# Patient Record
Sex: Male | Born: 1970 | Race: White | Hispanic: No | Marital: Married | Smoking: Never smoker
Health system: Southern US, Community
[De-identification: ages and names within clinical notes are randomized; demographics above are authoritative.]

## PROBLEM LIST (undated history)

## (undated) DIAGNOSIS — I10 Essential (primary) hypertension: Secondary | ICD-10-CM

## (undated) DIAGNOSIS — F419 Anxiety disorder, unspecified: Secondary | ICD-10-CM

## (undated) DIAGNOSIS — F32A Depression, unspecified: Secondary | ICD-10-CM

## (undated) DIAGNOSIS — F329 Major depressive disorder, single episode, unspecified: Secondary | ICD-10-CM

## (undated) HISTORY — PX: TONSILLECTOMY: SUR1361

## (undated) HISTORY — PX: FOOT SURGERY: SHX648

---

## 2012-09-25 ENCOUNTER — Inpatient Hospital Stay: Payer: Self-pay | Admitting: Internal Medicine

## 2012-09-25 LAB — COMPREHENSIVE METABOLIC PANEL
Alkaline Phosphatase: 92 U/L (ref 50–136)
BUN: 13 mg/dL (ref 7–18)
Bilirubin,Total: 0.7 mg/dL (ref 0.2–1.0)
Calcium, Total: 8.7 mg/dL (ref 8.5–10.1)
EGFR (African American): >60
Glucose: 126 mg/dL — ABNORMAL HIGH (ref 65–99)
Osmolality: 277 (ref 275–301)
SGOT(AST): 38 U/L — ABNORMAL HIGH (ref 15–37)
SGPT (ALT): 37 U/L (ref 12–78)
Sodium: 138 mmol/L (ref 136–145)
Total Protein: 7.4 g/dL (ref 6.4–8.2)

## 2012-09-25 LAB — URINALYSIS, COMPLETE
Blood: NEGATIVE
Glucose,UR: NEGATIVE mg/dL (ref 0–75)
Ketone: NEGATIVE
Leukocyte Esterase: NEGATIVE
RBC,UR: 2 /HPF (ref 0–5)
Specific Gravity: 1.03 (ref 1.003–1.030)
Squamous Epithelial: 4

## 2012-09-25 LAB — CBC
MCHC: 34.2 g/dL (ref 32.0–36.0)
MCV: 87 fL (ref 80–100)
Platelet: 239 10*3/uL (ref 150–440)
RBC: 4.46 10*6/uL (ref 4.40–5.90)
WBC: 21.7 10*3/uL — ABNORMAL HIGH (ref 3.8–10.6)

## 2012-09-26 LAB — CBC WITH DIFFERENTIAL/PLATELET
Basophil #: 0.1 10*3/uL (ref 0.0–0.1)
Basophil %: 0.3 %
Eosinophil %: 0.9 %
HCT: 36 % — ABNORMAL LOW (ref 40.0–52.0)
HGB: 12.2 g/dL — ABNORMAL LOW (ref 13.0–18.0)
Lymphocyte #: 2.1 10*3/uL (ref 1.0–3.6)
Lymphocyte %: 10.3 %
MCHC: 34 g/dL (ref 32.0–36.0)
MCV: 87 fL (ref 80–100)
Monocyte #: 1.7 x10 3/mm — ABNORMAL HIGH (ref 0.2–1.0)
Neutrophil #: 16.1 10*3/uL — ABNORMAL HIGH (ref 1.4–6.5)
Neutrophil %: 80.3 %
Platelet: 241 10*3/uL (ref 150–440)
WBC: 20.1 10*3/uL — ABNORMAL HIGH (ref 3.8–10.6)

## 2012-09-26 LAB — BASIC METABOLIC PANEL
Anion Gap: 7 (ref 7–16)
BUN: 9 mg/dL (ref 7–18)
Chloride: 103 mmol/L (ref 98–107)
Co2: 25 mmol/L (ref 21–32)
Creatinine: 0.94 mg/dL (ref 0.60–1.30)
EGFR (Non-African Amer.): >60
Osmolality: 269 (ref 275–301)
Potassium: 3.2 mmol/L — ABNORMAL LOW (ref 3.5–5.1)

## 2012-09-26 LAB — TSH: Thyroid Stimulating Horm: 0.841 u[IU]/mL

## 2012-09-26 LAB — MAGNESIUM: Magnesium: 1.7 mg/dL — ABNORMAL LOW

## 2012-09-27 LAB — CBC WITH DIFFERENTIAL/PLATELET
Basophil %: 0.2 %
Eosinophil %: 1.7 %
HCT: 34.4 % — ABNORMAL LOW (ref 40.0–52.0)
Lymphocyte %: 16.4 %
MCH: 29.6 pg (ref 26.0–34.0)
MCHC: 34 g/dL (ref 32.0–36.0)
Neutrophil #: 10.9 10*3/uL — ABNORMAL HIGH (ref 1.4–6.5)
Neutrophil %: 73.3 %
Platelet: 277 10*3/uL (ref 150–440)
RBC: 3.96 10*6/uL — ABNORMAL LOW (ref 4.40–5.90)
RDW: 13.9 % (ref 11.5–14.5)

## 2012-09-27 LAB — BASIC METABOLIC PANEL
Anion Gap: 6 — ABNORMAL LOW (ref 7–16)
BUN: 9 mg/dL (ref 7–18)
Calcium, Total: 8.6 mg/dL (ref 8.5–10.1)
Creatinine: 0.99 mg/dL (ref 0.60–1.30)
EGFR (African American): >60
Osmolality: 272 (ref 275–301)
Potassium: 3.2 mmol/L — ABNORMAL LOW (ref 3.5–5.1)
Sodium: 137 mmol/L (ref 136–145)

## 2012-09-28 LAB — WBC: WBC: 9.3 10*3/uL (ref 3.8–10.6)

## 2012-09-29 LAB — MAGNESIUM: Magnesium: 2.4 mg/dL

## 2012-09-30 LAB — URINALYSIS, COMPLETE
Bacteria: NONE SEEN
Bilirubin,UR: NEGATIVE
Glucose,UR: NEGATIVE mg/dL (ref 0–75)
Ketone: NEGATIVE
Leukocyte Esterase: NEGATIVE
Protein: NEGATIVE
RBC,UR: 2 /HPF (ref 0–5)
Specific Gravity: 1.01 (ref 1.003–1.030)
Squamous Epithelial: <1
WBC UR: 2 /HPF (ref 0–5)

## 2012-09-30 LAB — CULTURE, BLOOD (SINGLE)

## 2012-09-30 LAB — CREATININE, SERUM
Creatinine: 1.97 mg/dL — ABNORMAL HIGH (ref 0.60–1.30)
EGFR (African American): 48 — ABNORMAL LOW
EGFR (Non-African Amer.): 41 — ABNORMAL LOW

## 2012-09-30 LAB — BASIC METABOLIC PANEL
Chloride: 106 mmol/L (ref 98–107)
Co2: 29 mmol/L (ref 21–32)
Creatinine: 2.06 mg/dL — ABNORMAL HIGH (ref 0.60–1.30)
EGFR (Non-African Amer.): 39 — ABNORMAL LOW
Osmolality: 282 (ref 275–301)
Potassium: 4.4 mmol/L (ref 3.5–5.1)

## 2012-10-01 LAB — CBC WITH DIFFERENTIAL/PLATELET
Basophil #: 0.1 10*3/uL (ref 0.0–0.1)
Eosinophil #: 0.3 10*3/uL (ref 0.0–0.7)
HCT: 34.7 % — ABNORMAL LOW (ref 40.0–52.0)
HGB: 11.9 g/dL — ABNORMAL LOW (ref 13.0–18.0)
Lymphocyte %: 21.6 %
MCH: 29.6 pg (ref 26.0–34.0)
MCV: 86 fL (ref 80–100)
Monocyte #: 1.2 x10 3/mm — ABNORMAL HIGH (ref 0.2–1.0)
Monocyte %: 11.2 %
Neutrophil %: 63.3 %
Platelet: 337 10*3/uL (ref 150–440)
WBC: 10.4 10*3/uL (ref 3.8–10.6)

## 2012-10-01 LAB — BASIC METABOLIC PANEL
BUN: 15 mg/dL (ref 7–18)
Calcium, Total: 8.8 mg/dL (ref 8.5–10.1)
Chloride: 106 mmol/L (ref 98–107)
Co2: 28 mmol/L (ref 21–32)
Glucose: 117 mg/dL — ABNORMAL HIGH (ref 65–99)
Osmolality: 279 (ref 275–301)
Potassium: 3.6 mmol/L (ref 3.5–5.1)
Sodium: 139 mmol/L (ref 136–145)

## 2012-10-01 LAB — PROTEIN / CREATININE RATIO, URINE
Creatinine, Urine: 78 mg/dL (ref 30.0–125.0)
Protein/Creat. Ratio: 141 mg/gCREAT (ref 0–200)

## 2012-10-02 LAB — BASIC METABOLIC PANEL
Anion Gap: 4 — ABNORMAL LOW (ref 7–16)
BUN: 16 mg/dL (ref 7–18)
Calcium, Total: 8.7 mg/dL (ref 8.5–10.1)
Creatinine: 2.25 mg/dL — ABNORMAL HIGH (ref 0.60–1.30)
EGFR (African American): 41 — ABNORMAL LOW
Glucose: 82 mg/dL (ref 65–99)
Potassium: 4 mmol/L (ref 3.5–5.1)
Sodium: 139 mmol/L (ref 136–145)

## 2012-10-02 LAB — PROTEIN ELECTROPHORESIS(ARMC)

## 2012-10-02 LAB — WOUND CULTURE

## 2012-10-03 LAB — UR PROT ELECTROPHORESIS, URINE RANDOM

## 2014-05-10 NOTE — Consult Note (Signed)
Final Culture consistant with MRSA.is somewhat of a different presentation for MRSA as usually more skin is involved.to improvecare going wellimproving.improving.for discharge on Oral Abx in near future.tid wound care.7-10 days.  Electronic Signatures: Smith Robertope, Valita Righter S (MD)  (Signed on 12-Sep-14 13:51)  Authored  Last Updated: 12-Sep-14 13:51 by Smith Robertope, Paxton Kanaan S (MD)

## 2014-05-10 NOTE — Consult Note (Signed)
Patient seen, chart reviewed, films reviewed, note dictated. Scrotal cellulitis, urinary retention. The findings are more consistent with cellulitis at this time.  There has not been a declared abscess at present.  The initial skin lesion in the LEFT groin crease demonstrates no areas of fluctuance.  No other areas of fluctuance are appreciated throughout the scrotum.  This is mainly erythema and edema.  This will need to be monitored over the next 24-48 hours.  If there is evidence of a developing abscess, surgical drainage will be indicated.  If no abscess declares, antibiotic therapy is otherwise all that is indicated.  The bladder volume was overall minimal at the time of catheter placement.  This is not entirely consistent with urinary retention.  It is unlikely at his age.  Pain and other factors were likely more at play.  Would recommend starting Flomax 0.4 mg.  We will also recommend removing the Foley catheter in the morning with voiding trial.  We will continue to monitor on a daily basis as for any changes.  If there is any evidence of abscess, we will proceed as indicated.  If there are any further questions, please feel free to contact us.  Electronic Signatures: Smith Robertope, Denney Shein S (MD)  (Signed on 09-Sep-14 17:50)  Authored  Last Updated: 09-Sep-14 17:50 by Smith Robertope, Latonia Conrow S (MD)

## 2014-05-10 NOTE — Discharge Summary (Signed)
PATIENT NAME:  Alex Calderon, Nour MR#:  [id number] DATE OF BIRTH:  [birth year]  DATE OF ADMISSION:  09/25/2012 DATE OF DISCHARGE:  10/02/2012  ADMITTING DIAGNOSIS: Scrotal swelling, difficulty with urination.   DISCHARGE DIAGNOSES:  1. Scrotal swelling due to a scrotal abscess, status post incision and drainage of the scrotal abscess.  2. Acute renal failure, possibly due to interstitial nephritis, possibly related to vancomycin. Outpatient followup with nephrology.  3. Anxiety and depression.  4. History of plantar fasciitis release in both lower extremities.   CONSULTANTS:  1. Stann Mainlandavid P. Sampson GoonFitzgerald, MD 2. Madolyn FriezeBrian S. Achilles Dunkope, MD 3. Munsoor Lizabeth LeydenN. Lateef, MD  PERTINENT LABORATORIES AND EVALUATIONS: Admitting glucose 94, BUN 9, creatinine 0.99, sodium 137, potassium 3.2, chloride 105, CO2 is 26, calcium 8.6. WBC count 14.8, hemoglobin 11.7, platelet count 277. One culture showed MRSA, oxacillin resistant, Cipro  resistant, erythromycin resistant. Urine eosinophils negative. HIV was negative. Ultrasound of the kidneys showed no evidence of hydronephrosis. Kidneys appear normal size. Ultrasound of the testes shows increased scrotal wall thickening, may reflect sequela of cellulitis.   HOSPITAL COURSE: Please refer to H and P done by the admitting physician. The patient is a 44 year old male with history of chronic depression and anxiety, who presented with complaint of 2-day history of scrotal swelling. ER ultrasound was done and showed severe cellulitis. The patient was admitted for further evaluation and treatment. He was seen by ID and Dr. Achilles Dunkope of urology. The patient underwent an incision and drainage of the scrotal abscess done by urology, without any complications. The patient was continued with local wound care. He was continued on broad-spectrum IV antibiotics per ID discretion. The patient's renal function did worsen, and his creatinine went as high as 2.25. The patient was seen by nephrology, and they  recommended IV fluids. The patient was very anxious to go. Therefore, he is being discharged with outpatient nephrology followup.   DISCHARGE MEDICATIONS:  1. Adderall 10 one tab p.o. b.i.d. 2. Provigil 100 one tab p.o. daily. 3. Cymbalta 60 daily. 4. Klonopin 0.5 at bedtime. 5. Nucynta 50 mg 1 tab p.o. t.i.d. 6. Lyrica 50 one tab p.o. b.i.d. 7. Tylenol 650 q.4 p.r.n. 8. Norco 325/10 one tab p.o. t.i.d.  9. Flomax 0.4 daily.  10. Mag oxide 400 one tab p.o. b.i.d.  11. Cleocin 300 one tab p.o. q.6 for 7 days.   DRESSING CARE: Remove 2 packing strips in place, pack wound lightly with saline-soaked gauze, cover with clean dry dressing, perform 8 hours.   ACTIVITY: As tolerated.   FOLLOWUP:  1. Follow with Dr. Achilles Dunkope in 5 to 7 days. 2. Follow with Dr. Cherylann RatelLateef this Friday.  3. Follow with Dr. Sampson GoonFitzgerald in 1 to 2 weeks.   TIME SPENT: Note, 35 minutes spent on the discharge.    ____________________________ Lacie ScottsShreyang H. Allena KatzPatel, MD shp:OSi D: 10/04/2012 08:19:01 ET T: 10/04/2012 08:57:02 ET JOB#: [id number]  cc: Gabrella Stroh H. Allena KatzPatel, MD, <Dictator> Charise CarwinSHREYANG H April Colter MD ELECTRONICALLY SIGNED 10/06/2012 13:01

## 2014-05-10 NOTE — Consult Note (Signed)
Pt doing much better.down.improved.drainage from site. Less erythema.entered to start wound care.to dry saline soaked gauze q shift.  Electronic Signatures: Smith Robertope, Chaynce Schafer S (MD)  (Signed on 11-Sep-14 08:24)  Authored  Last Updated: 11-Sep-14 08:24 by Smith Robertope, Alessio Bogan S (MD)

## 2014-05-10 NOTE — Consult Note (Signed)
PATIENT NAME:  Gabriela EvesSENTER, Kathy MR#:  [id number] DATE OF BIRTH:  1970-09-22  DATE OF CONSULTATION:  09/26/2012  REFERRING PHYSICIAN: Dr. Renae GlossWieting  CONSULTING PHYSICIAN:  Stann Mainlandavid P. Sampson GoonFitzgerald, MD  REASON FOR CONSULTATION: Scrotal cellulitis.   HISTORY OF PRESENT ILLNESS: This is a pleasant [age] year old gentleman with history of depression, anxiety and a small fiber neuropathy, who approximately 48 hours ago, noticed what he described as an ingrown hair on his genitals. This developed into an area of an abscess. It started draining by itself and then he squeezed some further pus out. He then started getting increasing redness, swelling, fevers and chills. He also had difficulty urinating and came to the Emergency Room. There, he was noted to have a scrotal cellulitis as well as a markedly elevated white blood count. He had a Foley catheter placed. He was started on antibiotics including Zosyn and vancomycin.   Per the patient, his pain is slightly less, but the area of redness has not seemed to improve. He is still having some subjective fevers, but no further chills.   PAST MEDICAL HISTORY:  1.  [facility], on multiple meds for this.  2.  Pinched nerve.  3.  Plantar fasciitis.  4.  Anxiety and depression.   PAST SURGICAL HISTORY: Plantar fasciitis release in both lower extremities.   ALLERGIES: No known drug allergies.   ANTIBIOTICS SINCE ADMISSION: Zosyn as above and vancomycin.   OTHER MEDICATIONS: Include Norco, Adderall, Klonopin, Cymbalta, Lovenox, Provigil, morphine, Zofran, Lyrica, Zantac, Nucynta, magnesium oxide and potassium chloride.  REVIEW OF SYSTEMS: 11 systems reviewed and negative except as per HPI.   PHYSICAL EXAMINATION:  VITALS: T-max since admission 100.1, pulse 102, blood pressure 119/75, respirations 20, sat 98% on room air.  GENERAL: He is pleasant, interactive, in no acute distress.  HEENT: Pupils equal, round, and reactive to light and  accommodation. Extraocular movements are intact. Sclerae anicteric. Oropharynx clear.  NECK: Supple.  HEART: Regular. No murmurs.  LUNGS: Clear to auscultation bilaterally.  ABDOMEN: Soft, nontender, nondistended.  GENITOURINARY: He has a beat red cellulitis over his scrotum, which is quite enlarged. There is some areas moisture, but I cannot appreciate an area of focal drainage. The cellulitis and erythema does extend up onto his left groin, up into the pubic area. It is mildly tender to palpation.  EXTREMITIES: No clubbing, cyanosis or edema.   LABORATORY DATA: Blood count 09/08 negative x 2. Urine culture 09/08 no growth to date. Urinalysis done on admission reveals 2 white cells, 2 red cells, leukocyte esterase negative. White blood count on admission 21.7, now at 20.1, hemoglobin 12.2, platelets 241. Renal function on admission with a creatinine of 1.28, now down to 0.94. Magnesium 1.7. LFTs within normal limits except for slightly low albumin at 3.3.  IMAGING: CT of the abdomen and pelvis showed abnormal appearance of the scrotum, especially to the left of the midline consistent with inflammation. A discrete abscess was not demonstrated. There is likely a hydrocele. Within the inguinal canal, mildly increased density is seen that appears inflammatory. There is no inguinal or pelvic lymphadenopathy, but numerous normal sized lymph nodes are present in the inguinal region, especially on the right. No free fluid in the pelvis. Ultrasound of the testicles done on 09/08 revealed increased scrotal wall thickening, which may reflect the sequelae of cellulitis.   IMPRESSION: A 44 year old gentleman with no history of immunosuppression, who was admitted with a 2-day history of rapidly progressive cellulitis of the scrotum. This  was following a boil, which drained. He is currently on vancomycin and Zosyn has some slight clinical improvement, although his white count is still markedly elevated. Blood cultures  and urine cultures are negative. There is no drainage to be cultured from the scrotum itself.   RECOMMENDATIONS:  1.  Continue vancomycin and Zosyn for now.  2.  Consult urology.  3.  Elevate scrotum with a towel in order to help decrease edema.  4.  Monitor closely the cellulitis and progression. Hopefully, it will start to recede.  5.  Thank you for the consult. I will be glad to follow with you.  ____________________________ Stann Mainland. Sampson Goon, MD dpf:aw D: 09/26/2012 13:19:53 ET T: 09/26/2012 13:30:50 ET JOB#: [id number]  cc: Stann Mainland. Sampson Goon, MD, <Dictator> DAVID Sampson Goon MD ELECTRONICALLY SIGNED 09/26/2012 21:19

## 2014-05-10 NOTE — Consult Note (Signed)
Pt with less sweats.is a defined declared abscess in the left posterior hemiscrotum that was not definate yesterday.will require incision and drainage.and benefits discussed with patient.add to OR schedule today for I&D.except meds.  Electronic Signatures: Smith Robertope, Kolson Chovanec S (MD)  (Signed on 10-Sep-14 08:15)  Authored  Last Updated: 10-Sep-14 08:15 by Smith Robertope, Clotine Heiner S (MD)

## 2014-05-10 NOTE — Consult Note (Signed)
Large left declared scrotal abscess from inguinal region to buttock.with iodine gauze.gauze packing till AM.  start Wound care with saline soaked kling wet to dry in AM.Lovenox.gauze in jock as needed for soakage.obtained.  Electronic Signatures: Smith Robertope, Arali Somera S (MD)  (Signed on 10-Sep-14 14:46)  Authored  Last Updated: 10-Sep-14 14:46 by Smith Robertope, Carley Glendenning S (MD)

## 2014-05-10 NOTE — H&P (Signed)
PATIENT NAME:  Gabriela EvesSENTER, Zylen MR#:  [id number] DATE OF BIRTH:  1970/02/16  DATE OF ADMISSION:  09/25/2012  PRIMARY CARE PHYSICIAN: None local.   REFERRING PHYSICIAN: Dr. Zenda AlpersWebster.   CHIEF COMPLAINT: Scrotal swelling; unable to urinate since 2 p.m. yesterday.   HISTORY OF PRESENT ILLNESS: The patient is a 44 year old patient with a past medical history of chronic depression and anxiety. He presented to the ER with a chief complaint of a  2-day history of swelling genitals and a huge lump on the left inguinal area. The patient is reporting that he had some ingrown hair which was pulled and squeezed with the help of his wife. They noticed some bleeding, but no pus. Subsequently at 2 p.m. yesterday he noticed  swelling associated with redness and pain. The redness and swelling significantly increased tonight,  and the patient was unable to urinate since 2 p.m. yesterday.   The patient came to the ER. Ultrasound of the scrotum did not reveal any torsion, and CAT scan has revealed cellulitis. Cultures were obtained, and hospitalist team was called to admit the patient.   PAST MEDICAL HISTORY: Small-fiber neuropathy; pinched nerves, and both anxiety and depression.   PAST SURGICAL HISTORY: Plantar fasciitis release on both lower extremities.   ALLERGIES: No known drug allergies.   PSYCHOSOCIAL HISTORY: Lives at home with wife and children. No history of smoking, alcohol or illicit drug usage.   FAMILY HISTORY: Father has a history of diabetes mellitus, and mom has history of heart disease.   REVIEW OF SYSTEMS:  CONSTITUTIONAL:  Denies any fatigue, but complaining of fever.  EYES: Denies blurry vision, glaucoma.  EARS, NOSE, THROAT: No epistaxis or discharge.  RESPIRATORY: No cough, chronic obstructive pulmonary disease.  CARDIOVASCULAR: No chest pain.  GASTROINTESTINAL: Complaining of nausea. Denies any diarrhea.  GENITOURINARY: Complaining of difficulty with urination since 2 p.m. yesterday. No  hematuria. Male complaining of swollen scrotum with tenderness after pulling ingrown hair. No polyuria or nocturia.  HEMATOLOGIC AND LYMPHATIC: No anemia, easy bruising.  INTEGUMENTARY: No acne, rash, lesions.  MUSCULOSKELETAL: No joint pain in the neck, back, shoulder.  NEUROLOGIC:  No vertigo or ataxia.  PSYCHIATRIC: No ADD, but has history of anxiety and depression.   PHYSICAL EXAMINATION: VITAL SIGNS: Temperature 98.1, pulse 99, respirations 18, blood pressure 107/67, pulse ox is 96% on room air.  GENERAL APPEARANCE: Not in acute distress. Is moderately-built and nourished.  HEENT: Normocephalic, atraumatic. Pupils are equally reactive to light and accommodation. No scleral icterus. No conjunctival injection. No sinus tenderness. No postnasal drip.  NECK: Supple. No JVD. No thyromegaly. No lymphadenopathy.  LUNGS: Clear to auscultation bilaterally. No accessory muscle use, and no anterior chest wall tenderness on palpation.  CARDIAC: S1, S2 normal. Regular rate and rhythm. No murmurs.  GASTROINTESTINAL: Soft. Bowel sounds are positive in all 4 quadrants. Nontender, nondistended. No hepatosplenomegaly.  NEUROLOGIC: Awake, alert, and oriented x 3. Cranial nerves II-XII are grossly intact. Motor and sensory are grossly intact. Reflexes are 2+.  GENITALIA AREA: This was examined with the help of ER nurse as chaperone. The patient's scrotum is swollen, edematous, erythematous and tender to touch. Left inguinal area is swollen and indurated. The left inguinal area is swollen, with tender lymphadenopathy.  EXTREMITIES: No edema. No cyanosis. No clubbing.  PSYCHIATRIC: Normal mood and affect.   LABS AND IMAGING STUDIES: CAT scan of the abdomen and pelvis has revealed inflammation in subcutaneous tissues in the left inguinal region, extending down the left scrotum which may represent  cellulitis. No mass or fluid collection. Scattered adenopathy in the left inguinal region which may be reactive or  inflammatory.    Ultrasound of the scrotum has revealed a thickening of the scrotum which may be due to the edema, perhaps cellulitis. No evidence of torsion. Normal blood flow within both testicles and epididymides.   CBC: WBC is elevated at 21.7, hematocrit 38.7. The rest of the CBC is normal.   Urinalysis: Amber color. Glucose and bili, ketones are negative. Nitrite-negative. Leukocyte esterase are negative. Calcium oxalate crystals are present. Glucose 126, BUN 13, creatinine 1.0, sodium 138, potassium 3.4, chloride 105, CO2 27, GFR greater than 60 and anion gap 6, urine  osmolality 277.   ASSESSMENT AND PLAN: A 44 year old male coming into the ER with a chief complaint of a 2-day history of redness and swelling in the scrotal area. Will be admitted with the following assessment and plan:   1.  Acute scrotal cellulitis after pulling an ingrown hair: The patient will be admitted to the medical floor pain. Will obtain cultures. IV antibiotics: Zosyn will be provided. We will consider providing scrotal support if pain is not well-controlled. Will provide IV morphine on an as-needed basis.  2.  Fever, probably from scrotal cellulitis: Will get cultures, and the patient will be on antibiotics, provide Tylenol. 3.  Depression and anxiety: Stable. Denies any suicidal or homicidal ideation.  4. Chronic history of small-fiber neuropathy: Resume home medications. Provide GI and DVT prophylaxis.   FULL CODE.   The diagnosis and plan of care was discussed in detail with the patient and his wife at bedside. They both verbalized understanding of the plan.   Total time spent on admission was forty-five minutes.    ____________________________ Ramonita Lab, MD ag:dm D: 09/25/2012 07:49:12 ET T: 09/25/2012 08:24:26 ET JOB#: [id number]  cc: Ramonita Lab, MD, <Dictator> Ramonita Lab MD ELECTRONICALLY SIGNED 10/07/2012 7:08

## 2014-05-10 NOTE — Op Note (Signed)
PATIENT NAME:  Alex Calderon, Stanely MR#:  [id number] DATE OF BIRTH:  06-13-1970  DATE OF PROCEDURE:  09/27/2012  PRINCIPAL DIAGNOSIS:  Scrotal abscess.   POSTOPERATIVE DIAGNOSIS:  Scrotal abscess.  PROCEDURE:  Incision and drainage of scrotal abscess.   SURGEON:  Assunta GamblesBrian Neira Bentsen, M.D.   ANESTHESIA:  Laryngeal mask airway anesthesia.   INDICATIONS:  The patient is a 44 year old gentleman who recently presented with a two day history of swelling and discomfort in the left hemiscrotum.  He had noted the onset of a superficial bump in the inguinal crease.  He developed progressive swelling, pain and discomfort in the tissue with fever.  On examination he had changes consistent with extensive cellulitis throughout the scrotum and inguinal region.  There was no definitive evidence of abscess.  As time progressed there was the declaration of a prominent abscess in the left hemiscrotal and inguinal region.  He presents for incision and drainage.   DESCRIPTION OF PROCEDURE:  After informed consent was obtained, the patient was taken to the Operating Room and placed in the supine position on the operating table under laryngeal mask airway anesthesia.  The patient was then prepped and draped in the usual standard fashion.  The site of the drainage in the left inguinal crease approaching the left buttock was identified.  An approximate 1.5 cm incision was made through this area.  A large amount of purulent material was noted draining from the site.  Cultures were obtained from the material.  Digital manipulation was then performed demonstrating an approximate one finger length abscess cavity extending into the left buttock region.  The anterior aspect extended into the inguinal region along the cord contents.  The cavity was noted to be fairly large especially anterior.  A large amount of purulent material was drained from the site.  The area was then irrigated with ample amounts of iodine and saline solution.  A small amount  of bleeding was encountered.  Once the irrigation and drainage was completed with no additional areas of abscess identified, the wound cavity was packed with iodine-soaked gauze.  A 2 inch Kling was inserted into the more anterior aspect into the inguinal region.  A second 2 inch Betadine-soaked Kling was inserted into the more perineal and buttock region.  Fluffs and a scrotal support was then placed.  The patient was then awakened from laryngeal mask airway anesthesia and was taken to the recovery room in stable condition.  There were no problems or complications.  The patient tolerated the procedure well.   ESTIMATED BLOOD LOSS:  Approximately 50 mL.  Cultures were sent to the lab for further evaluation.     ____________________________ Madolyn FriezeBrian S. Achilles Dunkope, MD bsc:ea D: 09/27/2012 22:23:52 ET T: 09/27/2012 23:21:44 ET JOB#: [id number]  cc: Madolyn FriezeBrian S. Achilles Dunkope, MD, <Dictator> Madolyn FriezeBrian S. Achilles Dunkope, MD Madolyn FriezeBRIAN S Rishik Tubby MD ELECTRONICALLY SIGNED 09/29/2012 8:21

## 2014-08-05 ENCOUNTER — Encounter: Payer: Self-pay | Admitting: Emergency Medicine

## 2014-08-05 ENCOUNTER — Emergency Department
Admission: EM | Admit: 2014-08-05 | Discharge: 2014-08-05 | Disposition: A | Discharge status: Home / Self Care | Payer: 59 | Attending: Emergency Medicine | Admitting: Emergency Medicine

## 2014-08-05 DIAGNOSIS — I10 Essential (primary) hypertension: Secondary | ICD-10-CM | POA: Insufficient documentation

## 2014-08-05 DIAGNOSIS — F1120 Opioid dependence, uncomplicated: Secondary | ICD-10-CM | POA: Diagnosis present

## 2014-08-05 DIAGNOSIS — M79673 Pain in unspecified foot: Secondary | ICD-10-CM | POA: Insufficient documentation

## 2014-08-05 DIAGNOSIS — Z79899 Other long term (current) drug therapy: Secondary | ICD-10-CM | POA: Diagnosis not present

## 2014-08-05 DIAGNOSIS — G8929 Other chronic pain: Secondary | ICD-10-CM | POA: Insufficient documentation

## 2014-08-05 HISTORY — DX: Depression, unspecified: F32.A

## 2014-08-05 HISTORY — DX: Major depressive disorder, single episode, unspecified: F32.9

## 2014-08-05 HISTORY — DX: Anxiety disorder, unspecified: F41.9

## 2014-08-05 HISTORY — DX: Essential (primary) hypertension: I10

## 2014-08-05 NOTE — ED Notes (Signed)

## 2014-08-05 NOTE — Discharge Instructions (Signed)
As we discussed, we encourage you to continue pursuing a decreased dependence on narcotic medication.  You have good outpatient resources, and we have nothing else to offer you at this time.  Please work with her existing doctors and therapist to come up with a safe plan.  Her pain management doctor is the one most likely to be able to help you wean your dependence.    Chemical Dependency Chemical dependency is an addiction to drugs or alcohol. It is characterized by the repeated behavior of seeking out and using drugs and alcohol despite harmful consequences to the health and safety of ones self and others.  RISK FACTORS There are certain situations or behaviors that increase a person's risk for chemical dependency. These include:  A family history of chemical dependency.  A history of mental health issues, including depression and anxiety.  A home environment where drugs and alcohol are easily available to you.  Drug or alcohol use at a young age. SYMPTOMS  The following symptoms can indicate chemical dependency:  Inability to limit the use of drugs or alcohol.  Nausea, sweating, shakiness, and anxiety that occurs when alcohol or drugs are not being used.  An increase in amount of drugs or alcohol that is necessary to get drunk or high. People who experience these symptoms can assess their use of drugs and alcohol by asking themselves the following questions:  Have you been told by friends or family that they are worried about your use of alcohol or drugs?  Do friends and family ever tell you about things you did while drinking alcohol or using drugs that you do not remember?  Do you lie about using alcohol or drugs or about the amounts you use?  Do you have difficulty completing daily tasks unless you use alcohol or drugs?  Is the level of your work or school performance lower because of your drug or alcohol use?  Do you get sick from using drugs or alcohol but keep using  anyway?  Do you feel uncomfortable in social situations unless you use alcohol or drugs?  Do you use drugs or alcohol to help forget problems? An answer of yes to any of these questions may indicate chemical dependency. Professional evaluation is suggested. Document Released: 12/29/2000 Document Revised: 03/29/2011 Document Reviewed: 03/12/2010 [facility] Patient Information 2015 [place], [place]. This information is not intended to replace advice given to you by your health care provider. Make sure you discuss any questions you have with your health care provider.   Opioid Withdrawal Opioids are a group of narcotic drugs. They include the street drug heroin. They also include pain medicines, such as morphine, hydrocodone, oxycodone, and fentanyl. Opioid withdrawal is a group of characteristic physical and mental signs and symptoms. It typically occurs if you have been using opioids daily for several weeks or longer and stop using or rapidly decrease use. Opioid withdrawal can also occur if you have used opioids daily for a long time and are given a medicine to block the effect.  SIGNS AND SYMPTOMS Opioid withdrawal includes three or more of the following symptoms:   Depressed, anxious, or irritable mood.  Nausea or vomiting.  Muscle aches or spasms.   Watery eyes.   Runny nose.  Dilated pupils, sweating, or hairs standing on end.  Diarrhea or intestinal cramping.  Yawning.   Fever.  Increased blood pressure.  Fast pulse.  Restlessness or trouble sleeping. These signs and symptoms occur within several hours of stopping or reducing short-acting  opioids, such as heroin. They can occur within 3 days of stopping or reducing long-acting opioids, such as methadone. Withdrawal begins within minutes of receiving a drug that blocks the effects of opioids, such as naltrexone or naloxone. DIAGNOSIS  Opioid use disorder is diagnosed by your health care provider. You will be asked about  your symptoms, drug and alcohol use, medical history, and use of medicines. A physical exam may be done. Lab tests may be ordered. Your health care provider may have you see a mental health professional.  TREATMENT  The treatment for opioid withdrawal is usually provided by medical doctors with special training in substance use disorders (addiction specialists). The following medicines may be included in treatment:  Opioids given in place of the abused opioid. They turn on opioid receptors in the brain and lessen or prevent withdrawal symptoms. They are gradually decreased (opioid substitution and taper).  Non-opioids that can lessen certain opioid withdrawal symptoms. They may be used alone or with opioid substitution and taper. Successful long-term recovery usually requires medicine, counseling, and group support. HOME CARE INSTRUCTIONS   Take medicines only as directed by your health care provider.  Check with your health care provider before starting new medicines.  Keep all follow-up visits as directed by your health care provider. SEEK MEDICAL CARE IF:  You are not able to take your medicines as directed.  Your symptoms get worse.  You relapse. SEEK IMMEDIATE MEDICAL CARE IF:  You have serious thoughts about hurting yourself or others.  You have a seizure.  You lose consciousness. Document Released: 01/07/2003 Document Revised: 05/21/2013 Document Reviewed: 01/17/2013 [facility] Patient Information 2015 [place]. This information is not intended to replace advice given to you by your health care provider. Make sure you discuss any questions you have with your health care provider.

## 2014-08-05 NOTE — ED Notes (Signed)
BEHAVIORAL HEALTH ROUNDING Patient sleeping: No. Patient alert and oriented: yes Behavior appropriate: Yes.   Nutrition and fluids offered: Yes  Toileting and hygiene offered: Yes  Sitter present: q15 min observations and security camera monitoring Law enforcement present: Yes [employer]  ENVIRONMENTAL ASSESSMENT Potentially harmful objects out of patient reach: Yes.   Personal belongings secured: Yes.   Patient dressed in hospital provided attire only: Yes.   Plastic bags out of patient reach: Yes.   Patient care equipment (cords, cables, call bells, lines, and drains) shortened, removed, or accounted for: Yes.   Equipment and supplies removed from bottom of stretcher: Yes.   Potentially toxic materials out of patient reach: Yes.   Sharps container removed or out of patient reach: Yes.   

## 2014-08-05 NOTE — ED Notes (Signed)
Pt states that he has been using too much of his oxycodone. Pt says he is to the point of taking the medication 2 and 3 tablets at a time. He reports he has been taking the medication for about 5 years and recently (3 months ago) they added oxycotin 15 mg to his regimen. He has history of w/d about 2 years ago from hydrocodone. Pt alert and calm at this time

## 2014-08-05 NOTE — ED Provider Notes (Signed)
[facility] Emergency Department Provider Note  ____________________________________________  Time seen: Approximately 8:49 PM  I have reviewed the triage vital signs and the nursing notes.   HISTORY  Chief Complaint Addiction Problem    HPI Gabriela EvesJamie Ritacco is a 44 y.o. male with a past medical history of chronic pain due to plantar fasciitis who presents wanting help with addiction and dependence to narcotics.  The patient has a pain management doctor, a case manager, and his psychiatrist, all of whom he sees regularly.  This case manager suggested that perhaps he should come to the emergency department see if there are additional resources that could help.  He denies taking any medications in doses greater than those which are prescribed,and he takes no illegal drugs.  He does not use tobacco.  He has not had any withdrawal symptoms, specifically including headache, shortness of breath, abdominal pain, nausea/vomiting, diarrhea, and constipation.  He would just like to start weaning himself off of the narcotics.  He denies hallucinations and SI/HI.   Past Medical History  Diagnosis Date  . Hypertension   . Anxiety   . Depression     There are no active problems to display for this patient.   Past Surgical History  Procedure Laterality Date  . Tonsillectomy    . Foot surgery      Current Outpatient Rx  Name  Route  Sig  Dispense  Refill  . DULoxetine (CYMBALTA) 60 MG capsule   Oral   Take 60 mg by mouth 2 (two) times daily.         . LamoTRIgine (LAMICTAL PO)   Oral   Take 125 mg by mouth 2 (two) times daily.         . OxyCODONE (OXYCONTIN) 15 mg T12A 12 hr tablet   Oral   Take 15 mg by mouth every 12 (twelve) hours.         . OxyCODONE HCl 7.5 MG TABA   Oral   Take 7.5 mg by mouth 3 (three) times daily as needed.         . pregabalin (LYRICA) 75 MG capsule   Oral   Take 75 mg by mouth 3 (three) times daily.            Allergies Review of patient's allergies indicates no known allergies.  No family history on file.  Social History History  Substance Use Topics  . Smoking status: Never Smoker   . Smokeless tobacco: Not on file  . Alcohol Use: No    Review of Systems Constitutional: No fever/chills Eyes: No visual changes. ENT: No sore throat. Cardiovascular: Denies chest pain. Respiratory: Denies shortness of breath. Gastrointestinal: No abdominal pain.  No nausea, no vomiting.  No diarrhea.  No constipation. Genitourinary: Negative for dysuria. Musculoskeletal: Negative for back pain.  Chronic foot pain. Skin: Negative for rash. Neurological: Negative for headaches, focal weakness or numbness.  10-point ROS otherwise negative.  ____________________________________________   PHYSICAL EXAM:  VITAL SIGNS: ED Triage Vitals  Enc Vitals Group     BP 08/05/14 1805 141/82 mmHg     Pulse Rate 08/05/14 1805 97     Resp 08/05/14 1805 20     Temp 08/05/14 1805 97 F (36.1 C)     Temp Source 08/05/14 1805 Oral     SpO2 08/05/14 1805 97 %     Weight 08/05/14 1805 287 lb (130.182 kg)     Height 08/05/14 1805 5\' 8"  (1.727 m)  Head Cir --      Peak Flow --      Pain Score 08/05/14 1805 6     Pain Loc --      Pain Edu? --      Excl. in GC? --     Constitutional: Alert and oriented. Well appearing and in no acute distress. Eyes: Conjunctivae are normal. PERRL. EOMI. Head: Atraumatic. Nose: No congestion/rhinnorhea. Mouth/Throat: Mucous membranes are moist.  Oropharynx non-erythematous. Neck: No stridor.   Cardiovascular: Normal rate, regular rhythm. Grossly normal heart sounds.  Good peripheral circulation. Respiratory: Normal respiratory effort.  No retractions. Lungs CTAB. Gastrointestinal: Soft and nontender. No distention. No abdominal bruits. No CVA tenderness. Musculoskeletal: No lower extremity tenderness nor edema.  No joint effusions. Neurologic:  Normal speech and  language. No gross focal neurologic deficits are appreciated.  Skin:  Skin is warm, dry and intact. No rash noted. Psychiatric: Mood and affect are normal. Speech and behavior are normal.  Denies hallucinations, SI, and HI  ____________________________________________   LABS (all labs ordered are listed, but only abnormal results are displayed)  Not indicated ____________________________________________  EKG  Not indicated ____________________________________________  RADIOLOGY  Not indicated  ____________________________________________   PROCEDURES  Procedure(s) performed: None  Critical Care performed: No ____________________________________________   INITIAL IMPRESSION / ASSESSMENT AND PLAN / ED COURSE  Pertinent labs & imaging results that were available during my care of the patient were reviewed by me and considered in my medical decision making (see chart for details).  TTS evaluated the patient and we all agree that there is nothing else we can offer the patient.  He has extraordinary outpatient resources.  I complemented him on his insight and judgment and suggested he work with his current doctors and therapists to wean himself off of his narcotics.  He understands and agrees.  ____________________________________________  FINAL CLINICAL IMPRESSION(S) / ED DIAGNOSES  Final diagnoses:  Opioid dependence in controlled environment      NEW MEDICATIONS STARTED DURING THIS VISIT:  New Prescriptions   No medications on file     Loleta Rose, MD 08/05/14 2100

## 2014-08-05 NOTE — ED Notes (Signed)
BEHAVIORAL HEALTH ROUNDING Patient sleeping: No. Patient alert and oriented: yes Behavior appropriate: Yes.  ; If no, describe:  Nutrition and fluids offered: Yes  Toileting and hygiene offered: Yes  Sitter present: not applicable Law enforcement present: Yes  

## 2014-08-05 NOTE — ED Notes (Signed)
Pt here voluntary wanting help with addiction and dependence to narcotics. Pt currently takes  Oxycodone and oxycontin for neuropathy. Pt wants to come off all meds except lyrica, states lyrica is the only med that helps with his pain. No withdrawel sx noted in triage.
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
## 2014-11-25 IMAGING — US US PELVIS LIMITED
1 series · 14 of 25 positions shown · non-contrast
Comparison: none

REASON FOR EXAM: scrotal swelling and fever eval
COMMENTS:

PROCEDURE:     US  - US TESTICULAR  - September 25, 2012  [DATE]
RESULT:

[Series 1: us pelvis limited · 0.11mm/px · 14 of 53 slices shown]
[im 1/53]
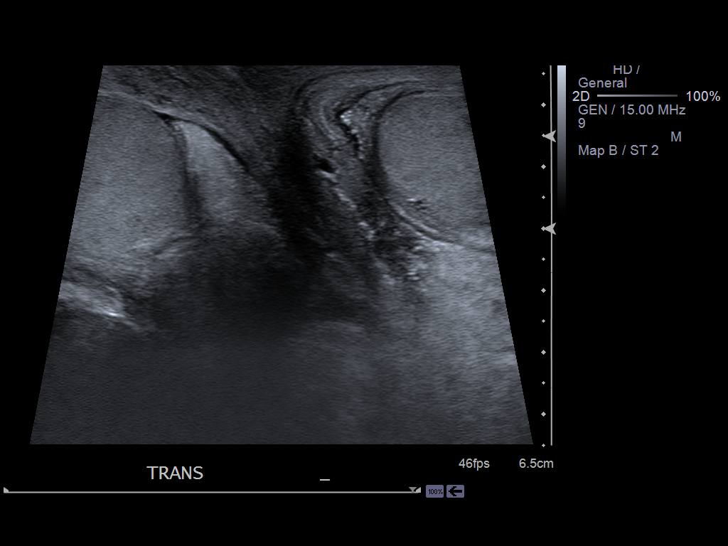
[im 5/53]
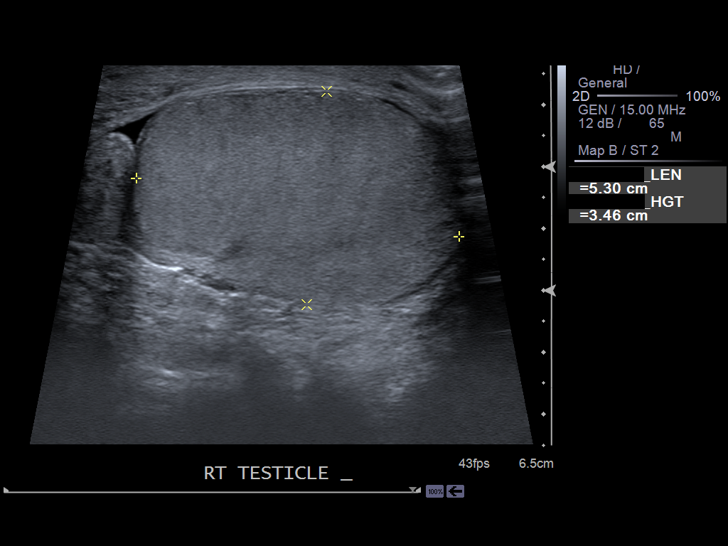
[im 9/53]
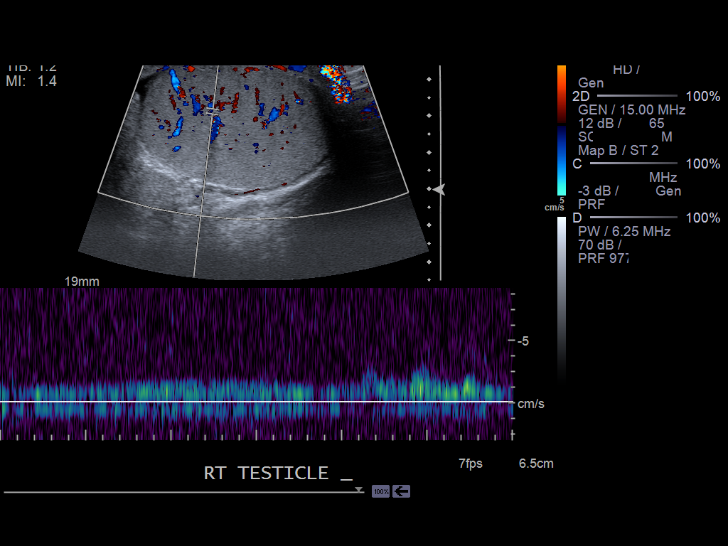
[im 14/53]
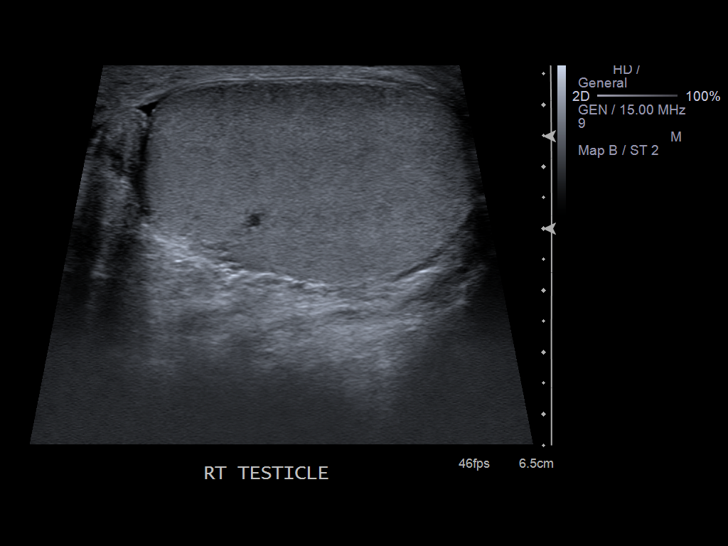
[im 18/53]
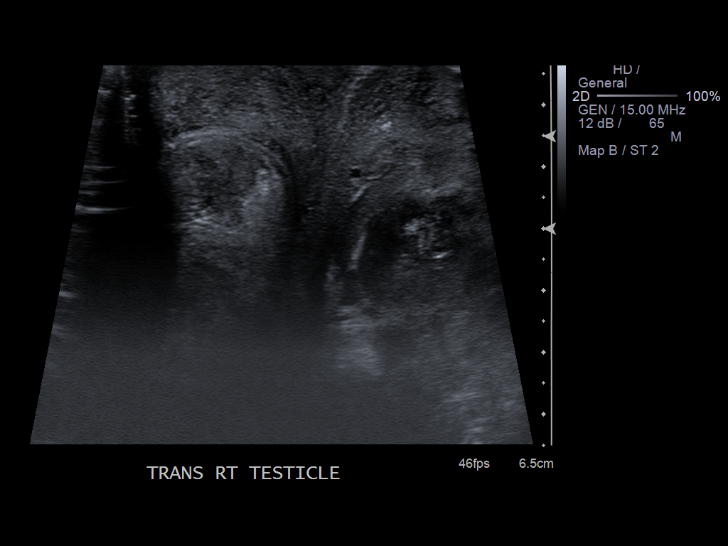
[im 20/53]
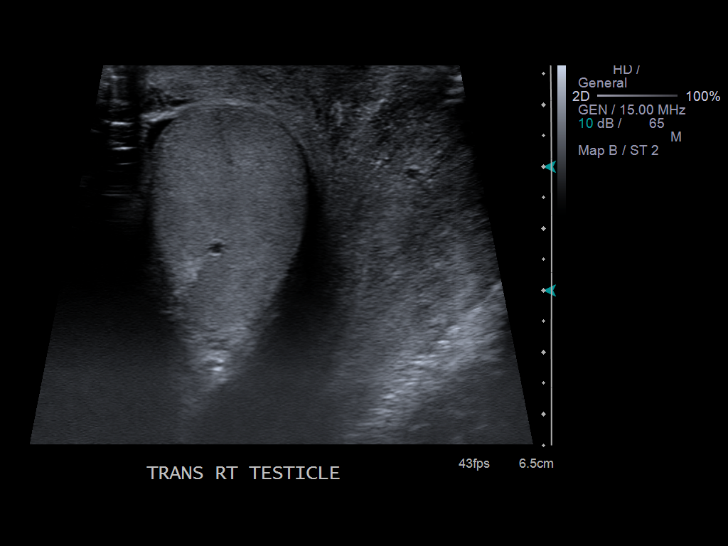
[im 24/53]
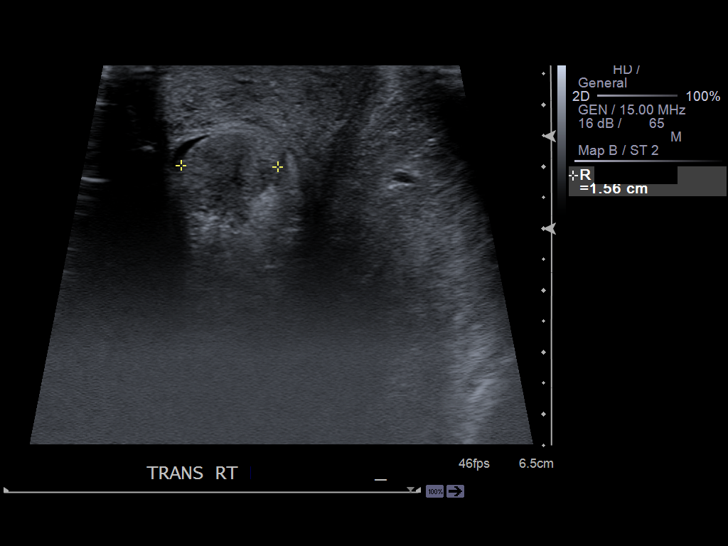
[im 29/53]
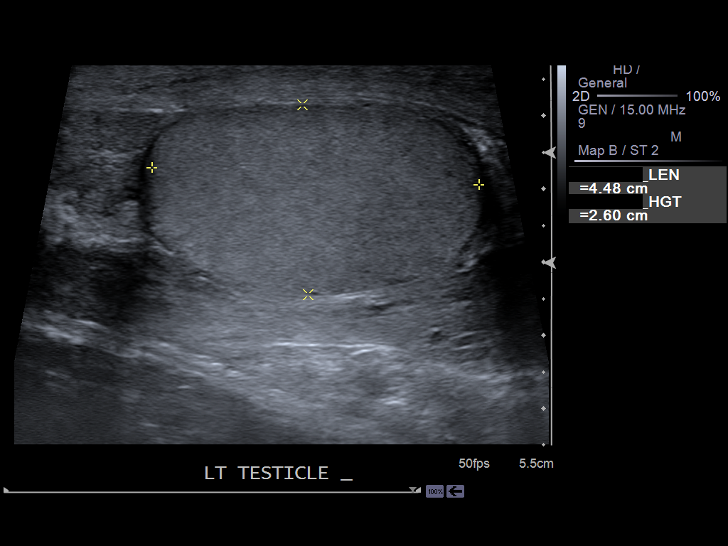
[im 33/53]
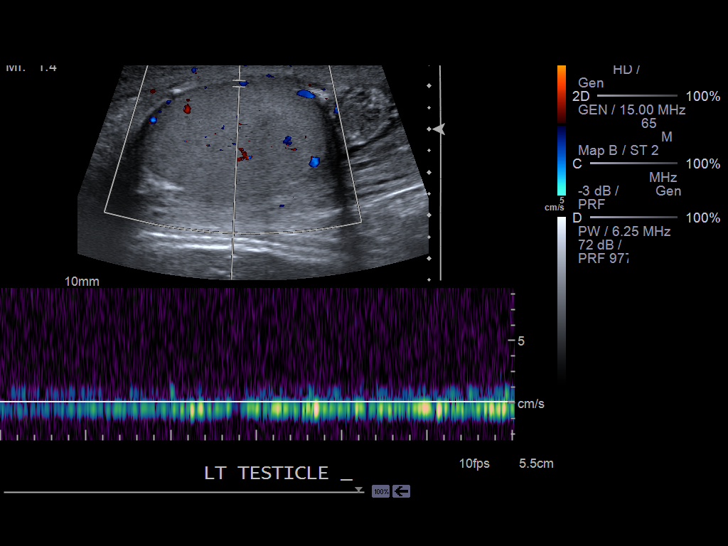
[im 35/53]
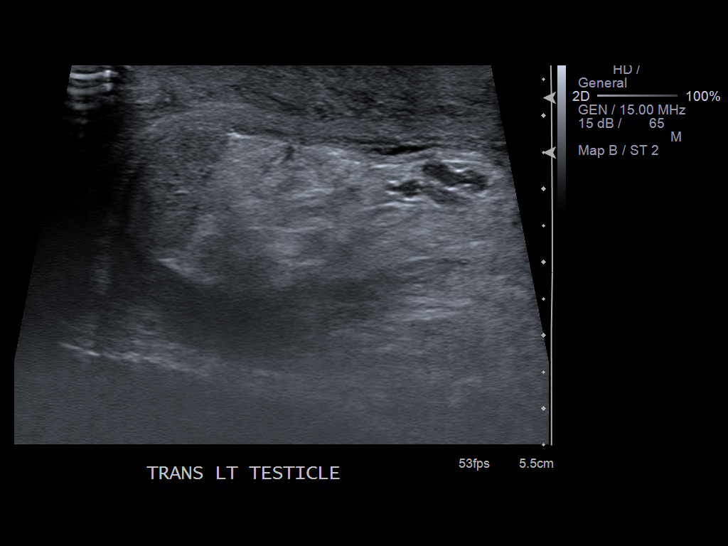
[im 40/53]
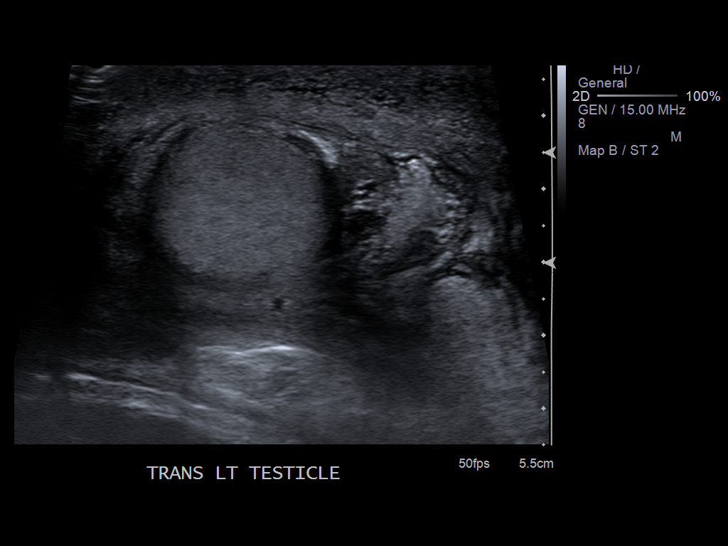
[im 44/53]
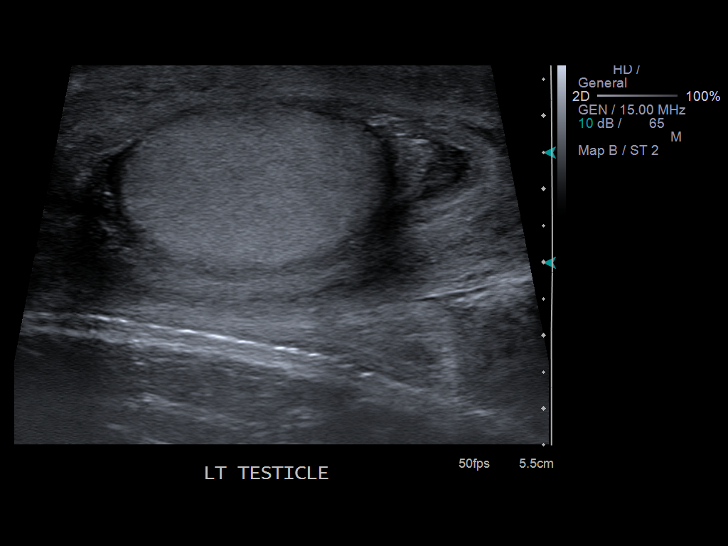
[im 48/53]
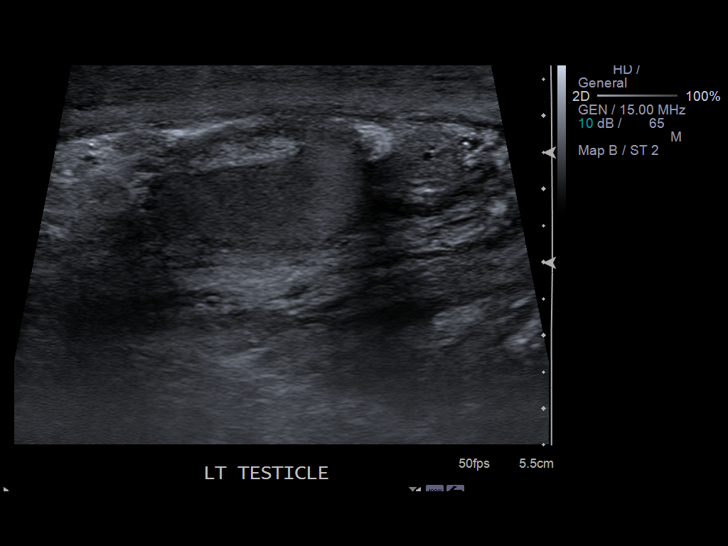
[im 53/53]
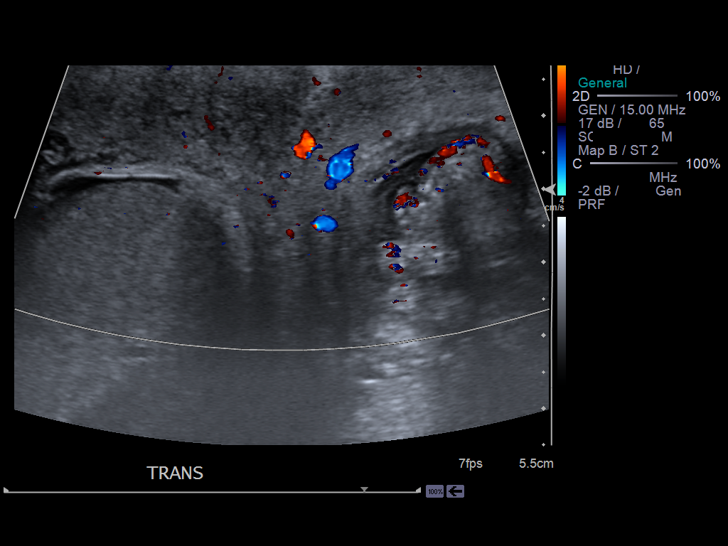

[14 of 25 positions shown; findings below may reference images not displayed]

FINDINGS: The right testicle measures 5.30 x 3.46 x 3.01 cm and the left
4.48 x 2.60 x 2.90 cm. There is homogeneous echotexture, symmetric flow,
color filling of vessels and arterial and venous waveforms appreciated. The
epididymides demonstrate homogeneous echotexture with the right measuring
1.62 x 1.56 x 0.92 cm and the left 1.27 x 1.02 x 1.13 cm. Color filling of
vessels are identified within the epididymides. A trace hydrocele is
identified within the right hemiscrotum. There is diffuse thickening of the
scrotum measuring 1 cm in thickness.
IMPRESSION: 1. Increased scrotal wall thickening which may reflect the sequela of
cellulitis.
2. No further sonographic abnormalities are identified.
3. Dr. Mylene of the Emergency Department was informed of these findings
via a preliminary faxed report.

## 2014-11-25 IMAGING — CT CT ABD-PELV W/ CM
1 of 4 series · 13 of 32 positions shown, 18 images · non-contrast
Comparison: none

REASON FOR EXAM: (1) left inguinal swelling and elevatd WBC eval; (2)
left inguinal swelling and
COMMENTS:

[Series 2: 3mm soft tissue · axial · 0.88mm/px · z∈[-564,-36]mm · 13 of 198 slices shown, 18 images]
[im 11/198  soft-tissue]
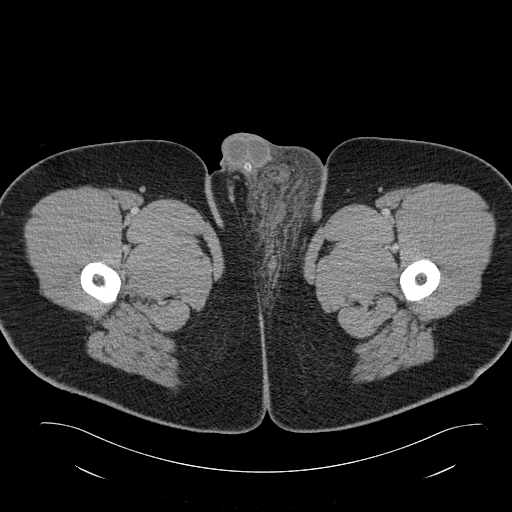
[im 11/198  bone]
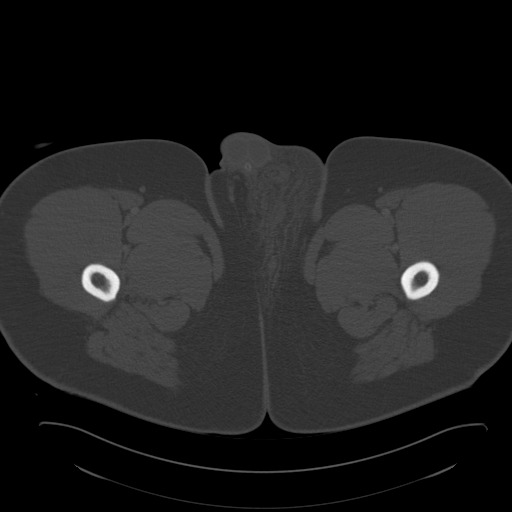
[im 32/198  soft-tissue]
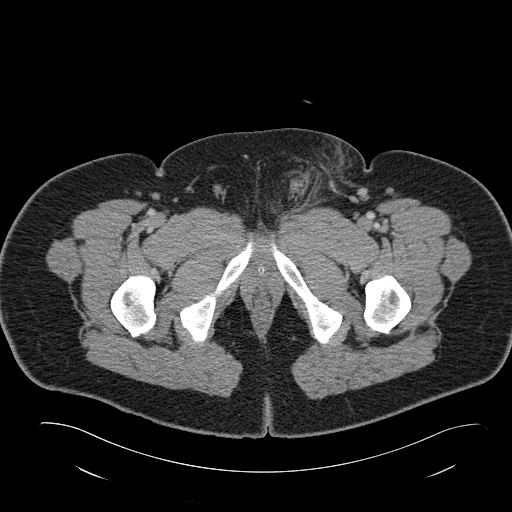
[im 42/198  soft-tissue]
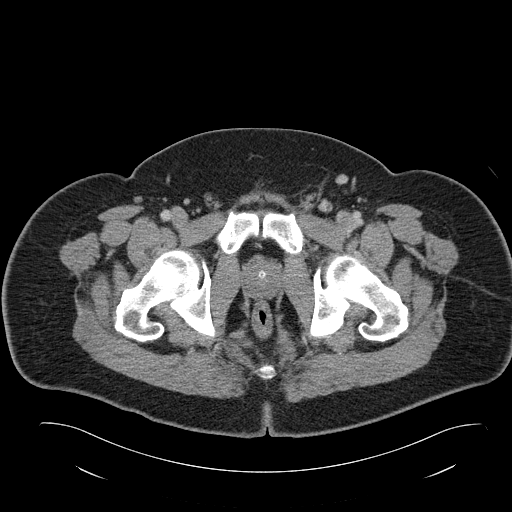
[im 63/198  soft-tissue]
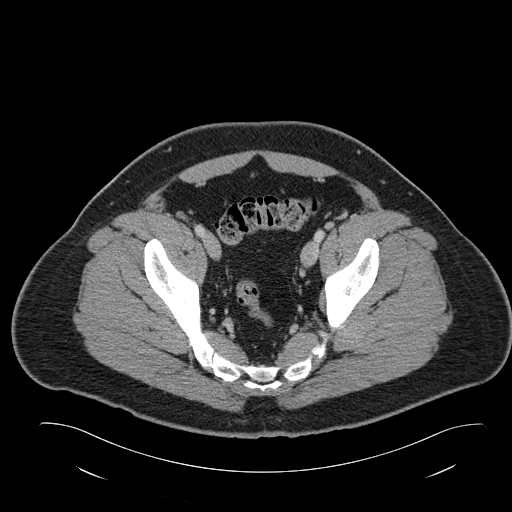
[im 73/198  soft-tissue]
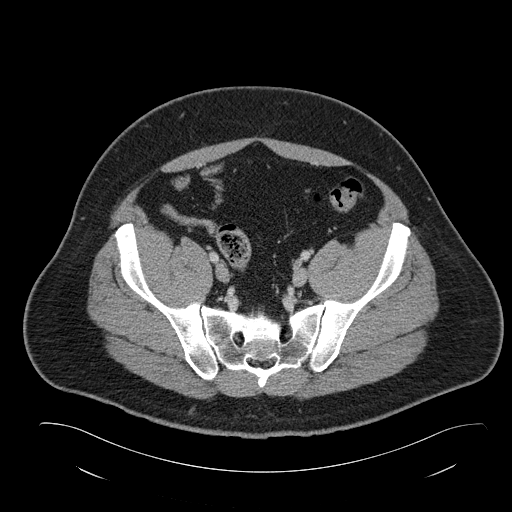
[im 94/198  soft-tissue]
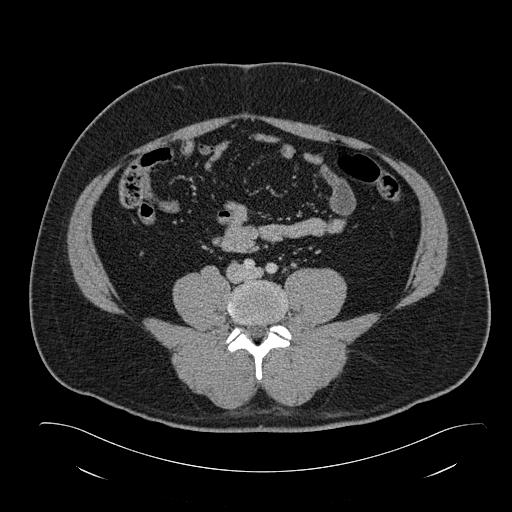
[im 104/198  soft-tissue]
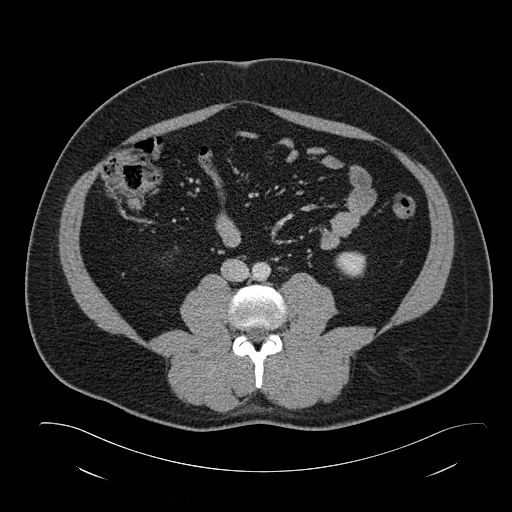
[im 125/198  soft-tissue]
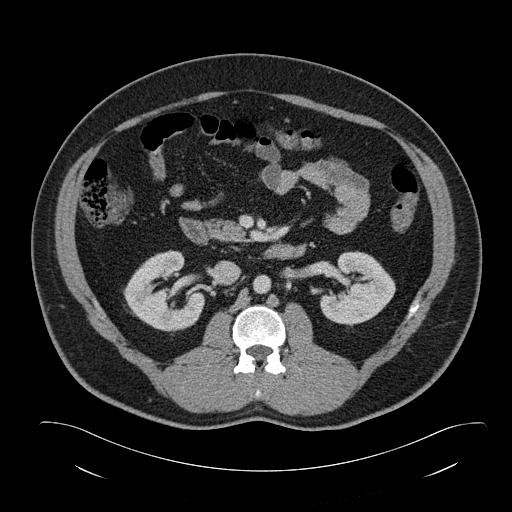
[im 135/198  soft-tissue]
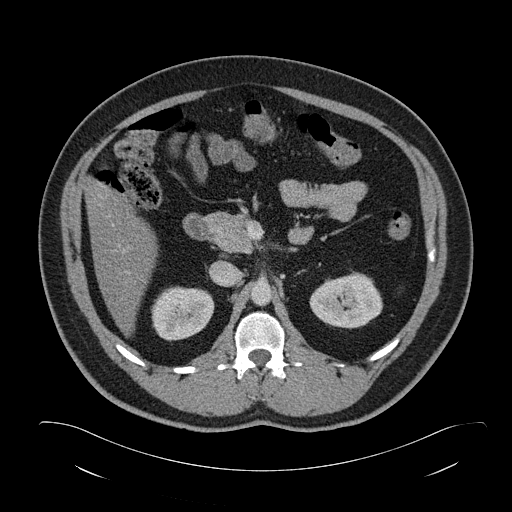
[im 135/198  bone]
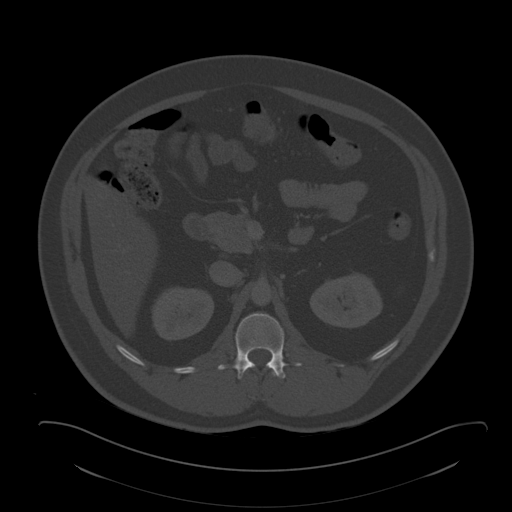
[im 156/198  soft-tissue]
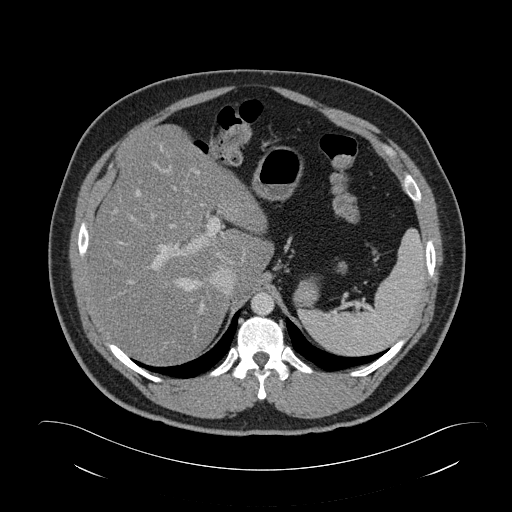
[im 156/198  lung]
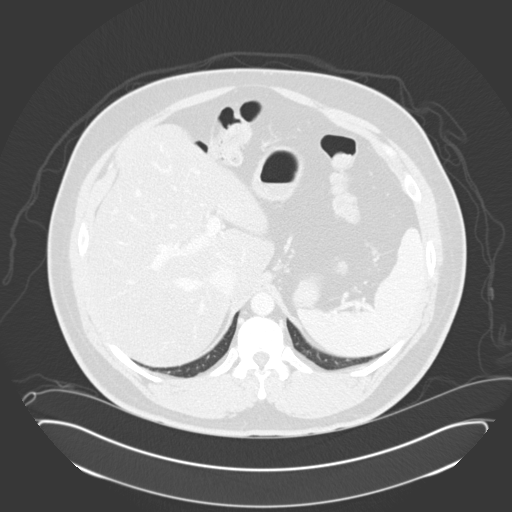
[im 166/198  soft-tissue]
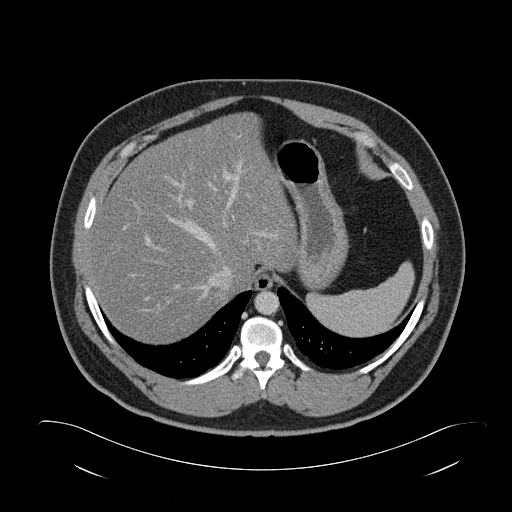
[im 166/198  lung]
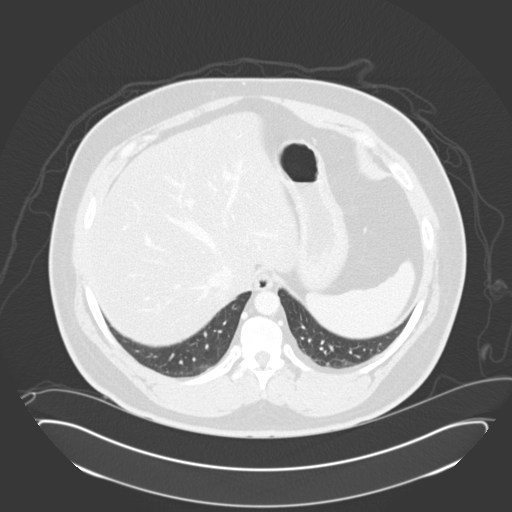
[im 177/198  lung]
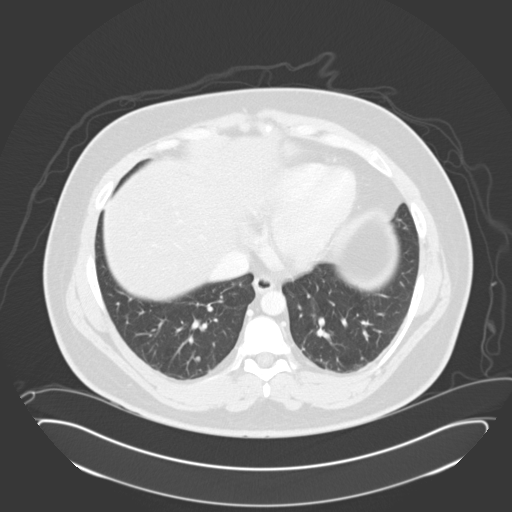
[im 187/198  soft-tissue]
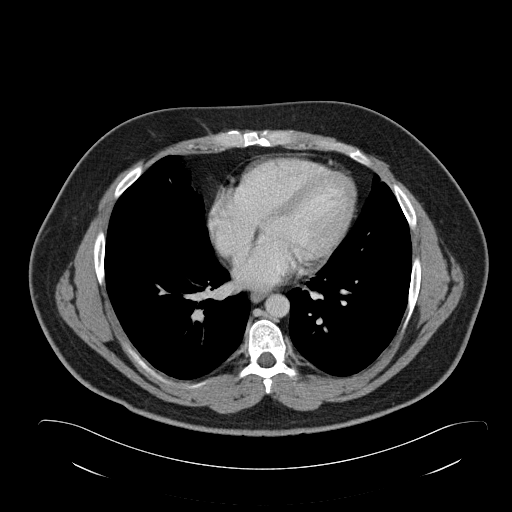
[im 187/198  lung]
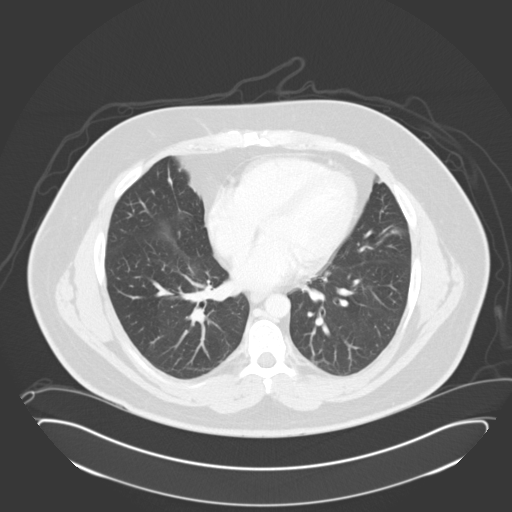

[13 of 32 positions shown; findings below may reference images not displayed]

PROCEDURE:     CT  - CT ABDOMEN / PELVIS  W  - September 25, 2012  [DATE]

RESULT:     Axial CT scanning was performed through the abdomen and pelvis
with reconstructions at 3 mm intervals and slice thicknesses. Review of
multiplanar reconstructed images was performed separately on the VIA monitor.

There is an abnormal appearance of the scrotum especially to the left of
midline consistent with inflammation. A discrete abscess is not
demonstrated. There is likely a hydrocele. Within the inguinal canal mildly
increased density is seen that appears inflammatory.

The urinary bladder is decompressed with a Foley catheter. There is no
inguinal nor pelvic lymphadenopathy, but numerous normal sized lymph nodes
are present in the inguinal regions especially on the left. There is no free
fluid in the pelvis. The rectosigmoid colon appears normal.

The liver exhibits decreased density diffusely consistent with fatty
infiltration. There is no intrahepatic ductal dilation. The gallbladder,
pancreas, spleen, partially distended stomach, adrenal glands, and kidneys
are normal in appearance. The caliber of the abdominal aorta is normal. The
unopacified loops of small and large bowel are normal in appearance. The
lung bases exhibit no acute abnormalities. The lumbar vertebral bodies are
preserved in height.
IMPRESSION: 1. There are inflammatory changes in the scrotum and adjacent inguinal soft
tissues consistent with cellulitis. A discrete abscess is not demonstrated.
Testicular ultrasound would be of value.
2. There is no acute bowel abnormality nor acute hepatobiliary abnormality.
There are fatty infiltrative changes of the liver.
3. The kidneys and ureters and urinary bladder appear normal. A Foley
catheter is present.

A preliminary report was sent to the [HOSPITAL] the conclusion
of the study.

[REDACTED]
